# Patient Record
Sex: Female | Born: 1965
Health system: Southern US, Community
[De-identification: ages and names within clinical notes are randomized; demographics above are authoritative.]

## PROBLEM LIST (undated history)

## (undated) DIAGNOSIS — J329 Chronic sinusitis, unspecified: Secondary | ICD-10-CM

## (undated) DIAGNOSIS — R51 Headache: Secondary | ICD-10-CM

## (undated) DIAGNOSIS — F419 Anxiety disorder, unspecified: Secondary | ICD-10-CM

## (undated) DIAGNOSIS — I10 Essential (primary) hypertension: Secondary | ICD-10-CM

## (undated) DIAGNOSIS — R519 Headache, unspecified: Secondary | ICD-10-CM

## (undated) DIAGNOSIS — M62838 Other muscle spasm: Secondary | ICD-10-CM

## (undated) DIAGNOSIS — T7840XA Allergy, unspecified, initial encounter: Secondary | ICD-10-CM

## (undated) HISTORY — PX: WISDOM TOOTH EXTRACTION: SHX21

## (undated) HISTORY — DX: Anxiety disorder, unspecified: F41.9

## (undated) HISTORY — DX: Allergy, unspecified, initial encounter: T78.40XA

---

## 2005-08-01 ENCOUNTER — Other Ambulatory Visit: Admission: RE | Admit: 2005-08-01 | Discharge: 2005-08-01 | Payer: Self-pay | Admitting: *Deleted

## 2011-04-07 ENCOUNTER — Other Ambulatory Visit (HOSPITAL_COMMUNITY)
Admission: RE | Admit: 2011-04-07 | Discharge: 2011-04-07 | Disposition: A | Discharge status: Home / Self Care | Payer: BC Managed Care – PPO | Source: Ambulatory Visit | Attending: Family Medicine | Admitting: Family Medicine

## 2011-04-07 ENCOUNTER — Other Ambulatory Visit: Payer: Self-pay | Admitting: Family Medicine

## 2011-04-07 DIAGNOSIS — Z1159 Encounter for screening for other viral diseases: Secondary | ICD-10-CM | POA: Insufficient documentation

## 2011-04-07 DIAGNOSIS — Z124 Encounter for screening for malignant neoplasm of cervix: Secondary | ICD-10-CM | POA: Insufficient documentation

## 2011-12-10 ENCOUNTER — Other Ambulatory Visit: Payer: Self-pay | Admitting: Family Medicine

## 2014-06-16 ENCOUNTER — Other Ambulatory Visit: Payer: Self-pay | Admitting: Family Medicine

## 2014-06-16 ENCOUNTER — Ambulatory Visit
Admission: RE | Admit: 2014-06-16 | Discharge: 2014-06-16 | Disposition: A | Discharge status: Home / Self Care | Payer: BC Managed Care – PPO | Source: Ambulatory Visit | Attending: Family Medicine | Admitting: Family Medicine

## 2014-06-16 DIAGNOSIS — R109 Unspecified abdominal pain: Secondary | ICD-10-CM

## 2014-09-05 ENCOUNTER — Ambulatory Visit (INDEPENDENT_AMBULATORY_CARE_PROVIDER_SITE_OTHER): Payer: BC Managed Care – PPO

## 2014-09-05 ENCOUNTER — Ambulatory Visit (HOSPITAL_COMMUNITY)
Admission: RE | Admit: 2014-09-05 | Discharge: 2014-09-05 | Disposition: A | Discharge status: Home / Self Care | Payer: BC Managed Care – PPO | Source: Ambulatory Visit | Attending: Internal Medicine | Admitting: Internal Medicine

## 2014-09-05 ENCOUNTER — Ambulatory Visit (INDEPENDENT_AMBULATORY_CARE_PROVIDER_SITE_OTHER): Payer: BC Managed Care – PPO | Admitting: Internal Medicine

## 2014-09-05 VITALS — BP 120/82 | HR 96 | Temp 98.3°F | Resp 16 | Ht 68.0 in | Wt 238.4 lb

## 2014-09-05 DIAGNOSIS — M25569 Pain in unspecified knee: Secondary | ICD-10-CM | POA: Diagnosis not present

## 2014-09-05 DIAGNOSIS — M25561 Pain in right knee: Secondary | ICD-10-CM

## 2014-09-05 DIAGNOSIS — M7989 Other specified soft tissue disorders: Secondary | ICD-10-CM

## 2014-09-05 DIAGNOSIS — M79609 Pain in unspecified limb: Secondary | ICD-10-CM | POA: Diagnosis present

## 2014-09-05 LAB — POCT CBC
GRANULOCYTE PERCENT: 57.4 % (ref 37–80)
HEMATOCRIT: 48.4 % — AB (ref 37.7–47.9)
Hemoglobin: 15.8 g/dL (ref 12.2–16.2)
Lymph, poc: 4.1 — AB (ref 0.6–3.4)
MCH, POC: 31.3 pg — AB (ref 27–31.2)
MCHC: 32.7 g/dL (ref 31.8–35.4)
MCV: 95.5 fL (ref 80–97)
MID (cbc): 0.2 (ref 0–0.9)
MPV: 7.8 fL (ref 0–99.8)
POC Granulocyte: 5.9 (ref 2–6.9)
POC LYMPH %: 40.2 % (ref 10–50)
POC MID %: 2.4 %M (ref 0–12)
Platelet Count, POC: 199 10*3/uL (ref 142–424)
RBC: 5.06 M/uL (ref 4.04–5.48)
RDW, POC: 14.4 %
WBC: 10.2 10*3/uL (ref 4.6–10.2)

## 2014-09-05 LAB — D-DIMER, QUANTITATIVE (NOT AT ARMC): D DIMER QUANT: 0.27 ug{FEU}/mL (ref 0.00–0.48)

## 2014-09-05 LAB — POCT SEDIMENTATION RATE: POCT SED RATE: 9 mm/hr (ref 0–22)

## 2014-09-05 MED ORDER — TRAMADOL HCL 50 MG PO TABS: 50.0000 mg | ORAL_TABLET | Freq: Three times a day (TID) | ORAL | Status: AC | PRN

## 2014-09-05 NOTE — Progress Notes (Signed)
   Subjective:    Patient ID: Diana Moore, female    DOB: [birth year], 48 y.o.   MRN: [id number]  HPI    Review of Systems     Objective:   Physical Exam        Assessment & Plan:

## 2014-09-05 NOTE — Patient Instructions (Addendum)
Go to Admitting at [facility] and register as an outpatient for lower extremity venous doppler  Deep Vein Thrombosis A deep vein thrombosis (DVT) is a blood clot that develops in the deep, larger veins of the leg, arm, or pelvis. These are more dangerous than clots that might form in veins near the surface of the body. A DVT can lead to serious and even life-threatening complications if the clot breaks off and travels in the bloodstream to the lungs.  A DVT can damage the valves in your leg veins so that instead of flowing upward, the blood pools in the lower leg. This is called post-thrombotic syndrome, and it can result in pain, swelling, discoloration, and sores on the leg. CAUSES Usually, several things contribute to the formation of blood clots. Contributing factors include:  The flow of blood slows down.  The inside of the vein is damaged in some way.  You have a condition that makes blood clot more easily. RISK FACTORS Some people are more likely than others to develop blood clots. Risk factors include:   Smoking.  Being overweight (obese).  Sitting or lying still for a long time. This includes long-distance travel, paralysis, or recovery from an illness or surgery. Other factors that increase risk are:   Older age, especially over 21 years of age.  Having a family history of blood clots or if you have already had a blot clot.  Having major or lengthy surgery. This is especially true for surgery on the hip, knee, or belly (abdomen). Hip surgery is particularly high risk.  Having a long, thin tube (catheter) placed inside a vein during a medical procedure.  Breaking a hip or leg.  Having cancer or cancer treatment.  Pregnancy and childbirth.  Hormone changes make the blood clot more easily during pregnancy.  The fetus puts pressure on the veins of the pelvis.  There is a risk of injury to veins during delivery or a caesarean delivery. The risk is highest just after  childbirth.  Medicines containing the female hormone estrogen. This includes birth control pills and hormone replacement therapy.  Other circulation or heart problems.  SIGNS AND SYMPTOMS When a clot forms, it can either partially or totally block the blood flow in that vein. Symptoms of a DVT can include:  Swelling of the leg or arm, especially if one side is much worse.  Warmth and redness of the leg or arm, especially if one side is much worse.  Pain in an arm or leg. If the clot is in the leg, symptoms may be more noticeable or worse when standing or walking. The symptoms of a DVT that has traveled to the lungs (pulmonary embolism, PE) usually start suddenly and include:  Shortness of breath.  Coughing.  Coughing up blood or blood-tinged mucus.  Chest pain. The chest pain is often worse with deep breaths.  Rapid heartbeat. Anyone with these symptoms should get emergency medical treatment right away. Do not wait to see if the symptoms will go away. Call your local emergency services (911 in the [place] if you have these symptoms. Do not drive yourself to the hospital. DIAGNOSIS If a DVT is suspected, your health care provider will take a full medical history and perform a physical exam. Tests that also may be required include:  Blood tests, including studies of the clotting properties of the blood.  Ultrasound to see if you have clots in your legs or lungs.  X-rays to show the flow of blood  when dye is injected into the veins (venogram).  Studies of your lungs if you have any chest symptoms. PREVENTION  Exercise the legs regularly. Take a brisk 30-minute walk every day.  Maintain a weight that is appropriate for your height.  Avoid sitting or lying in bed for long periods of time without moving your legs.  Women, particularly those over the age of 35 years, should consider the risks and benefits of taking estrogen medicines, including birth control pills.  Do not smoke,  especially if you take estrogen medicines.  Long-distance travel can increase your risk of DVT. You should exercise your legs by walking or pumping the muscles every hour.  Many of the risk factors above relate to situations that exist with hospitalization, either for illness, injury, or elective surgery. Prevention may include medical and nonmedical measures.  Your health care provider will assess you for the need for venous thromboembolism prevention when you are admitted to the hospital. If you are having surgery, your surgeon will assess you the day of or day after surgery. TREATMENT Once identified, a DVT can be treated. It can also be prevented in some circumstances. Once you have had a DVT, you may be at increased risk for a DVT in the future. The most common treatment for DVT is blood-thinning (anticoagulant) medicine, which reduces the blood's tendency to clot. Anticoagulants can stop new blood clots from forming and stop old clots from growing. They cannot dissolve existing clots. Your body does this by itself over time. Anticoagulants can be given by mouth, through an IV tube, or by injection. Your health care provider will determine the best program for you. Other medicines or treatments that may be used are:  Heparin or related medicines (low molecular weight heparin) are often the first treatment for a blood clot. They act quickly. However, they cannot be taken orally and must be given either in shot form or by IV tube.  Heparin can cause a fall in a component of blood that stops bleeding and forms blood clots (platelets). You will be monitored with blood tests to be sure this does not occur.  Warfarin is an anticoagulant that can be swallowed. It takes a few days to start working, so usually heparin or related medicines are used in combination. Once warfarin is working, heparin is usually stopped.  Factor Xa inhibitor medicines, such as rivaroxaban and apixaban, also reduce blood  clotting. These medicines are taken orally and can often be used without heparin or related medicines.  Less commonly, clot dissolving drugs (thrombolytics) are used to dissolve a DVT. They carry a high risk of bleeding, so they are used mainly in severe cases where your life or a part of your body is threatened.  Very rarely, a blood clot in the leg needs to be removed surgically.  If you are unable to take anticoagulants, your health care provider may arrange for you to have a filter placed in a main vein in your abdomen. This filter prevents clots from traveling to your lungs. HOME CARE INSTRUCTIONS  Take all medicines as directed by your health care provider.  Learn as much as you can about DVT.  Wear a medical alert bracelet or carry a medical alert card.  Ask your health care provider how soon you can go back to normal activities. It is important to stay active to prevent blood clots. If you are on anticoagulant medicine, avoid contact sports.  It is very important to exercise. This is especially important  while traveling, sitting, or standing for long periods of time. Exercise your legs by walking or by tightening and relaxing your leg muscles regularly. Take frequent walks.  You may need to wear compression stockings. These are tight elastic stockings that apply pressure to the lower legs. This pressure can help keep the blood in the legs from clotting. Taking Warfarin Warfarin is a daily medicine that is taken by mouth. Your health care provider will advise you on the length of treatment (usually 3-6 months, sometimes lifelong). If you take warfarin:  Understand how to take warfarin and foods that can affect how warfarin works in Public relations account executive.  Too much and too little warfarin are both dangerous. Too much warfarin increases the risk of bleeding. Too little warfarin continues to allow the risk for blood clots. Warfarin and Regular Blood Testing While taking warfarin, you will need to  have regular blood tests to measure your blood clotting time. These blood tests usually include both the prothrombin time (PT) and international normalized ratio (INR) tests. The PT and INR results allow your health care provider to adjust your dose of warfarin. It is very important that you have your PT and INR tested as often as directed by your health care provider.  Warfarin and Your Diet Avoid major changes in your diet, or notify your health care provider before changing your diet. Arrange a visit with a registered dietitian to answer your questions. Many foods, especially foods high in vitamin K, can interfere with warfarin and affect the PT and INR results. You should eat a consistent amount of foods high in vitamin K. Foods high in vitamin K include:   Spinach, kale, broccoli, cabbage, collard and turnip greens, Brussels sprouts, peas, cauliflower, seaweed, and parsley.  Beef and pork liver.  Green tea.  Soybean oil. Warfarin with Other Medicines Many medicines can interfere with warfarin and affect the PT and INR results. You must:  Tell your health care provider about any and all medicines, vitamins, and supplements you take, including aspirin and other over-the-counter anti-inflammatory medicines. Be especially cautious with aspirin and anti-inflammatory medicines. Ask your health care provider before taking these.  Do not take or discontinue any prescribed or over-the-counter medicine except on the advice of your health care provider or pharmacist. Warfarin Side Effects Warfarin can have side effects, such as easy bruising and difficulty stopping bleeding. Ask your health care provider or pharmacist about other side effects of warfarin. You will need to:  Hold pressure over cuts for longer than usual.  Notify your dentist and other health care providers that you are taking warfarin before you undergo any procedures where bleeding may occur. Warfarin with Alcohol and Tobacco    Drinking alcohol frequently can increase the effect of warfarin, leading to excess bleeding. It is best to avoid alcoholic drinks or to consume only very small amounts while taking warfarin. Notify your health care provider if you change your alcohol intake.   Do not use any tobacco products including cigarettes, chewing tobacco, or electronic cigarettes. If you smoke, quit. Ask your health care provider for help with quitting smoking. Alternative Medicines to Warfarin: Factor Xa Inhibitor Medicines  These blood-thinning medicines are taken by mouth, usually for several weeks or longer. It is important to take the medicine every single day at the same time each day.  There are no regular blood tests required when using these medicines.  There are fewer food and drug interactions than with warfarin.  The side effects of  this class of medicine are similar to those of warfarin, including excessive bruising or bleeding. Ask your health care provider or pharmacist about other potential side effects. SEEK MEDICAL CARE IF:  You notice a rapid heartbeat.  You feel weaker or more tired than usual.  You feel faint.  You notice increased bruising.  You feel your symptoms are not getting better in the time expected.  You believe you are having side effects of medicine. SEEK IMMEDIATE MEDICAL CARE IF:  You have chest pain.  You have trouble breathing.  You have new or increased swelling or pain in one leg.  You cough up blood.  You notice blood in vomit, in a bowel movement, or in urine. MAKE SURE YOU:  Understand these instructions.  Will watch your condition.  Will get help right away if you are not doing well or get worse. Document Released: 12/08/2005 Document Revised: 04/24/2014 Document Reviewed: 08/15/2013 [facility] Patient Information 2015 [place], [place]. This information is not intended to replace advice given to you by your health care provider. Make sure you discuss any  questions you have with your health care provider. Muscle Strain A muscle strain is an injury that occurs when a muscle is stretched beyond its normal length. Usually a small number of muscle fibers are torn when this happens. Muscle strain is rated in degrees. First-degree strains have the least amount of muscle fiber tearing and pain. Second-degree and third-degree strains have increasingly more tearing and pain.  Usually, recovery from muscle strain takes 1-2 weeks. Complete healing takes 5-6 weeks.  CAUSES  Muscle strain happens when a sudden, violent force placed on a muscle stretches it too far. This may occur with lifting, sports, or a fall.  RISK FACTORS Muscle strain is especially common in athletes.  SIGNS AND SYMPTOMS At the site of the muscle strain, there may be:  Pain.  Bruising.  Swelling.  Difficulty using the muscle due to pain or lack of normal function. DIAGNOSIS  Your health care provider will perform a physical exam and ask about your medical history. TREATMENT  Often, the best treatment for a muscle strain is resting, icing, and applying cold compresses to the injured area.  HOME CARE INSTRUCTIONS   Use the PRICE method of treatment to promote muscle healing during the first 2-3 days after your injury. The PRICE method involves:  Protecting the muscle from being injured again.  Restricting your activity and resting the injured body part.  Icing your injury. To do this, put ice in a plastic bag. Place a towel between your skin and the bag. Then, apply the ice and leave it on from 15-20 minutes each hour. After the third day, switch to moist heat packs.  Apply compression to the injured area with a splint or elastic bandage. Be careful not to wrap it too tightly. This may interfere with blood circulation or increase swelling.  Elevate the injured body part above the level of your heart as often as you can.  Only take over-the-counter or prescription medicines  for pain, discomfort, or fever as directed by your health care provider.  Warming up prior to exercise helps to prevent future muscle strains. SEEK MEDICAL CARE IF:   You have increasing pain or swelling in the injured area.  You have numbness, tingling, or a significant loss of strength in the injured area. MAKE SURE YOU:   Understand these instructions.  Will watch your condition.  Will get help right away if you are not  doing well or get worse. Document Released: 12/08/2005 Document Revised: 09/28/2013 Document Reviewed: 07/07/2013 [facility] Patient Information 2015 [place], [place]. This information is not intended to replace advice given to you by your health care provider. Make sure you discuss any questions you have with your health care provider.

## 2014-09-05 NOTE — Progress Notes (Signed)
   Subjective:    Patient ID: Diana Moore, female    DOB: 28-May-1966, 48 y.o.   MRN: [id number]  HPI PCP: Dr. Sigmund Hazel 48 year old female here for right calf pain. Initially she had a sharp pain in her leg. Now the pain is more an ache. The pain has been off and on for two to three months. She can get some relief with NSAIDs. Mild swelling. The pain is medial and posterior on the calf. Broke her right leg three years ago;had a blood clot after she broke her leg.     Review of Systems     Objective:   Physical Exam  Constitutional: She is oriented to person, place, and time. She appears well-developed and well-nourished. No distress.  HENT:  Head: Normocephalic.  Eyes: EOM are normal. No scleral icterus.  Neck: Normal range of motion. Neck supple.  Cardiovascular: Normal rate.   Pulmonary/Chest: Effort normal.  Musculoskeletal: Normal range of motion. She exhibits edema and tenderness.       Right lower leg: She exhibits tenderness and swelling. She exhibits no bony tenderness, no deformity and no laceration.       Legs: Neurological: She is alert and oriented to person, place, and time. No cranial nerve deficit. She exhibits normal muscle tone. Coordination normal.  Skin: No rash noted.  Psychiatric: She has a normal mood and affect. Her behavior is normal. Judgment and thought content normal.   Tender right calf and swollen  [facility] reading (PRIMARY) by  Dr.Wynter Grave normal xr         Assessment & Plan:  Doppler Ultasound now Ddimer

## 2014-09-05 NOTE — Progress Notes (Addendum)
*  PRELIMINARY RESULTS* Vascular Ultrasound Right lower extremity venous duplex has been completed.  Preliminary findings: no evidence of DVT. There is a small hypoechoic area in the mid calf, suggesting a muscle tear.  Called results to Dr. Ernestene Mention office. Message will be relayed to Dr. Perrin Maltese. Patient can leave now.   Farrel Demark, RDMS, RVT  09/05/2014, 3:25 PM

## 2014-10-13 ENCOUNTER — Other Ambulatory Visit: Payer: Self-pay | Admitting: Obstetrics and Gynecology

## 2014-10-13 ENCOUNTER — Other Ambulatory Visit (HOSPITAL_COMMUNITY)
Admission: RE | Admit: 2014-10-13 | Discharge: 2014-10-13 | Disposition: A | Discharge status: Home / Self Care | Payer: BC Managed Care – PPO | Source: Ambulatory Visit | Attending: Obstetrics and Gynecology | Admitting: Obstetrics and Gynecology

## 2014-10-13 DIAGNOSIS — Z1151 Encounter for screening for human papillomavirus (HPV): Secondary | ICD-10-CM | POA: Diagnosis present

## 2014-10-13 DIAGNOSIS — Z01419 Encounter for gynecological examination (general) (routine) without abnormal findings: Secondary | ICD-10-CM | POA: Diagnosis present

## 2014-10-16 LAB — CYTOLOGY - PAP

## 2014-11-13 NOTE — H&P (Signed)
HPI:  General:  48yo G1P1 who presents for laparoscopic right ovarian cystectomy, possible oophorectomy, pelvic washings and bilateral salpingectomy due to complex right ovarian cyst.  In review, she reports 8/10, sharp constant pain that started ~3 days before her menses. Once her menses started, the pain was better, but still present.  An ultrasound was performed and a complex ovarian cyst was identified.       ROS:  CONSTITUTIONAL:  no Chills. no Fever. no Skin rash.  HEENT:  Blurrred vision no.  CARDIOLOGY:  no Chest pain.  RESPIRATORY:  no Shortness of breath. no Cough.  GASTROENTEROLOGY:  no Appetite change. no Change in bowel movements.  UROLOGY:  no Urinary frequency. no Urinary incontinence. no Urinary urgency.  FEMALE REPRODUCTIVE:  no Breast lumps or discharge. no Breast pain. No vaginal discharge, itching or burning.  No hot flashes or night sweats. NEUROLOGY:  no Dizziness. no Headache.         Medical History:  HTN, Hyperlipidemia, +tobaccos use, Hx migraines, Seasonal allergies, generalized anxiety disorder.        Gyn History:  Sexual activity currently sexually active.  Periods : every month, lasts 7 days and bleeding is heavy.  LMP 11/07/14.  Denies H/O Birth control.  Last pap smear date 03/2011.  Denies H/O Last mammogram date.  Abnormal pap smear 07/2005, LGSIL.        OB History:  Number of pregnancies 3, 1 miscarriage, 1 EAB, 1 c/s.  Pregnancy # 1 miscarriage.  Pregnancy # 2 miscarriage.  Pregnancy # 3 live birth, C-section, boy.        Surgical History: tympanostomy tubes bilateral 2011, C/ Section .        Hospitalization/Major Diagnostic Procedure: Not in last year 2015.        Family History: Father: alive 6271 yrs, COPD, aneurysm, diagnosed with COPDMother: alive 671 yrs, A + WBrother 1: alive 40 yrs, A + WSister 1: alive 5433 yrs, A + WSon(s): alive 16 yrs, A + W No cancers.  Uncles and aunts with COPD. All smokers.  No MIs in the family.        Social History:  General:  Tobacco use  cigarettes: Current smoker Frequency: 1/2 PPD Tobacco history last updated 10/13/2014 Smoking: yes.  Alcohol: yes, occasionally.  Caffeine: yes, coffee, soda.  no Recreational drug use.  no Diet.  no Exercise.  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  Marital Status: married.  Children: 1, Boys.        Medications:  Fluticasone Propionate 50 MCG/ACT Suspension 2 sprays Once a day, Notes: prn,  Taking Claritin(Loratadine) 10 MG Tablet 1 tablet Once a day, Notes: prn,  Taking Cyclobenzaprine HCl 10.0 Milligram Tablet 1 tablet Once a night,  Taking Xanax(ALPRAZolam) 0.25 MG Tablet 1 tablet twice a day prn anxiety,  Taking Ziac(Bisoprolol-Hydrochlorothiazide) 5-6.25 MG Tablet 1 tablet Once a day,  Taking Sumatriptan Succinate 100.0 Milligram Tablet take 1 tablet by mouth as needed Taking Percocet prn pain       Allergies: N.K.D.A.  Objective: performed in office    Vitals: Wt 238, Ht 67.75, BMI 36.45, Pulse sitting 85, BP sitting 115/84.       Examination:  General Examination:  GENERAL APPEARANCE alert, oriented, NAD, pleasant.  SKIN: normal, no rash.  LUNGS: clear to auscultation bilaterally, no wheezes, rhonchi, rales.  HEART: no murmurs, regular rate and rhythm.  ABDOMEN: soft and not tender, no masses palpated, no rebound, no rigidity, no suprapubic tenderness.  FEMALE GENITOURINARY: normal  external genitalia, labia - unremarkable, vagina - pink moist mucosa, no lesions or abnormal discharge, minimal dark blood noted c/w menses, cervix - no discharge or lesions or CMT, adnexa - no masses or tenderness, uterus - nontender and normal size on palpation- difficult to assess due to obesity.  BACK: no CVA tenderness.  EXTREMITIES: normal range of motion, no edema present.     TVUS: 10/19/14: Right complex cyst with hyperechoic and cystic components- 5.9x5.7x5.4cm- no BF noted, no free fluid in the pelvis. Left ovary  unremarkable. Uterus inhomogenous- no discrete mass seen. Concern for possible dermoid cyst.    A/P: 18XW48yo G1P1 who presents for laparoscopic right ovarian cystectomy, possible oophorectomy, bilateral salpingectomy and pelvic washings. -NPO -LR @ 125cc/hr -SCDs to OR -CBC, BMP, T&S to be completed -Risk, benefit and indications reviewed with patient including risk of bleeding, infection and injury to surrounding organs.  Questions and concerns were addressed and inform consent was obtained.  Myna HidalgoJennifer Terius Jacuinde, DO [phone] (pager) [phone] (office)

## 2014-11-21 NOTE — Patient Instructions (Addendum)
   Your procedure is scheduled on:  Thursday, Dec 3  [facility]: 11 AM Pick up the phone at the desk and dial [phone] and inform us of your arrival.  Please call this number if you have any problems the morning of surgery: [phone]  Remember: Do not eat food after midnight: Wednesday Do not drink clear liquids after: 8:30 AM Thursday, day of surgery Take these medicines the morning of surgery with a SIP OF WATER: bisoprolol and xanax   Do not wear jewelry, make-up, or FINGER nail polish No metal in your hair or on your body. Do not wear lotions, powders, perfumes.  You may wear deodorant.  Do not bring valuables to the hospital. Contacts, dentures or bridgework may not be worn into surgery.  Patients discharged on the day of surgery will not be allowed to drive home.  Home with husband Broadus JohnWarren cell [phone].

## 2014-11-22 ENCOUNTER — Encounter (HOSPITAL_COMMUNITY)
Admission: RE | Admit: 2014-11-22 | Discharge: 2014-11-22 | Disposition: A | Discharge status: Home / Self Care | Payer: BC Managed Care – PPO | Source: Ambulatory Visit | Attending: Obstetrics & Gynecology | Admitting: Obstetrics & Gynecology

## 2014-11-22 ENCOUNTER — Other Ambulatory Visit: Payer: Self-pay

## 2014-11-22 ENCOUNTER — Encounter (HOSPITAL_COMMUNITY): Payer: Self-pay

## 2014-11-22 DIAGNOSIS — N8320 Unspecified ovarian cysts: Secondary | ICD-10-CM | POA: Diagnosis present

## 2014-11-22 DIAGNOSIS — E785 Hyperlipidemia, unspecified: Secondary | ICD-10-CM | POA: Diagnosis not present

## 2014-11-22 DIAGNOSIS — E669 Obesity, unspecified: Secondary | ICD-10-CM | POA: Diagnosis not present

## 2014-11-22 DIAGNOSIS — K219 Gastro-esophageal reflux disease without esophagitis: Secondary | ICD-10-CM | POA: Diagnosis not present

## 2014-11-22 DIAGNOSIS — Z6836 Body mass index (BMI) 36.0-36.9, adult: Secondary | ICD-10-CM | POA: Diagnosis not present

## 2014-11-22 DIAGNOSIS — G43909 Migraine, unspecified, not intractable, without status migrainosus: Secondary | ICD-10-CM | POA: Diagnosis not present

## 2014-11-22 DIAGNOSIS — I10 Essential (primary) hypertension: Secondary | ICD-10-CM | POA: Diagnosis not present

## 2014-11-22 DIAGNOSIS — F17200 Nicotine dependence, unspecified, uncomplicated: Secondary | ICD-10-CM | POA: Diagnosis not present

## 2014-11-22 HISTORY — DX: Chronic sinusitis, unspecified: J32.9

## 2014-11-22 HISTORY — DX: Headache, unspecified: R51.9

## 2014-11-22 HISTORY — DX: Other muscle spasm: M62.838

## 2014-11-22 HISTORY — DX: Essential (primary) hypertension: I10

## 2014-11-22 HISTORY — DX: Headache: R51

## 2014-11-22 LAB — BASIC METABOLIC PANEL
Anion gap: 10 (ref 5–15)
BUN: 10 mg/dL (ref 6–23)
CALCIUM: 8.7 mg/dL (ref 8.4–10.5)
CO2: 23 mEq/L (ref 19–32)
Chloride: 104 mEq/L (ref 96–112)
Creatinine, Ser: 0.75 mg/dL (ref 0.50–1.10)
GFR calc non Af Amer: >90 mL/min (ref >90)
Glucose, Bld: 110 mg/dL — ABNORMAL HIGH (ref 70–99)
POTASSIUM: 4.6 meq/L (ref 3.7–5.3)
Sodium: 137 mEq/L (ref 137–147)

## 2014-11-22 LAB — TYPE AND SCREEN
ABO/RH(D): O POS
Antibody Screen: NEGATIVE

## 2014-11-22 LAB — CBC
HCT: 43.5 % (ref 36.0–46.0)
Hemoglobin: 14.9 g/dL (ref 12.0–15.0)
MCH: 32.5 pg (ref 26.0–34.0)
MCHC: 34.3 g/dL (ref 30.0–36.0)
MCV: 95 fL (ref 78.0–100.0)
PLATELETS: 190 10*3/uL (ref 150–400)
RBC: 4.58 MIL/uL (ref 3.87–5.11)
RDW: 13.5 % (ref 11.5–15.5)
WBC: 7.5 10*3/uL (ref 4.0–10.5)

## 2014-11-22 LAB — ABO/RH: ABO/RH(D): O POS

## 2014-11-23 ENCOUNTER — Ambulatory Visit (HOSPITAL_COMMUNITY): Payer: BC Managed Care – PPO | Admitting: Anesthesiology

## 2014-11-23 ENCOUNTER — Ambulatory Visit (HOSPITAL_COMMUNITY)
Admission: RE | Admit: 2014-11-23 | Discharge: 2014-11-23 | Disposition: A | Discharge status: Home / Self Care | Payer: BC Managed Care – PPO | Source: Ambulatory Visit | Attending: Obstetrics & Gynecology | Admitting: Obstetrics & Gynecology

## 2014-11-23 ENCOUNTER — Encounter (HOSPITAL_COMMUNITY): Payer: Self-pay | Admitting: *Deleted

## 2014-11-23 ENCOUNTER — Encounter (HOSPITAL_COMMUNITY)
Admission: RE | Disposition: A | Discharge status: Home / Self Care | Payer: Self-pay | Source: Ambulatory Visit | Attending: Obstetrics & Gynecology

## 2014-11-23 DIAGNOSIS — N8320 Unspecified ovarian cysts: Secondary | ICD-10-CM | POA: Diagnosis not present

## 2014-11-23 DIAGNOSIS — E785 Hyperlipidemia, unspecified: Secondary | ICD-10-CM | POA: Insufficient documentation

## 2014-11-23 DIAGNOSIS — Z6836 Body mass index (BMI) 36.0-36.9, adult: Secondary | ICD-10-CM | POA: Insufficient documentation

## 2014-11-23 DIAGNOSIS — I10 Essential (primary) hypertension: Secondary | ICD-10-CM | POA: Insufficient documentation

## 2014-11-23 DIAGNOSIS — G43909 Migraine, unspecified, not intractable, without status migrainosus: Secondary | ICD-10-CM | POA: Insufficient documentation

## 2014-11-23 DIAGNOSIS — K219 Gastro-esophageal reflux disease without esophagitis: Secondary | ICD-10-CM | POA: Insufficient documentation

## 2014-11-23 DIAGNOSIS — E669 Obesity, unspecified: Secondary | ICD-10-CM | POA: Insufficient documentation

## 2014-11-23 DIAGNOSIS — F17200 Nicotine dependence, unspecified, uncomplicated: Secondary | ICD-10-CM | POA: Insufficient documentation

## 2014-11-23 HISTORY — PX: LAPAROSCOPIC OVARIAN CYSTECTOMY: SHX6248

## 2014-11-23 LAB — PREGNANCY, URINE: PREG TEST UR: NEGATIVE

## 2014-11-23 SURGERY — EXCISION, CYST, OVARY, LAPAROSCOPIC
Anesthesia: General | Site: Abdomen | Laterality: Right

## 2014-11-23 MED ORDER — SCOPOLAMINE 1 MG/3DAYS TD PT72
MEDICATED_PATCH | TRANSDERMAL | Status: AC
  Filled 2014-11-23: qty 1

## 2014-11-23 MED ORDER — IBUPROFEN 600 MG PO TABS: 600.0000 mg | ORAL_TABLET | Freq: Four times a day (QID) | ORAL | Status: AC | PRN

## 2014-11-23 MED ORDER — GLYCOPYRROLATE 0.2 MG/ML IJ SOLN
INTRAMUSCULAR | Status: AC
  Filled 2014-11-23: qty 1

## 2014-11-23 MED ORDER — NEOSTIGMINE METHYLSULFATE 10 MG/10ML IV SOLN
INTRAVENOUS | Status: AC
  Filled 2014-11-23: qty 1

## 2014-11-23 MED ORDER — ROCURONIUM BROMIDE 100 MG/10ML IV SOLN
INTRAVENOUS | Status: DC | PRN
  Administered 2014-11-23: 10 mg via INTRAVENOUS
  Administered 2014-11-23: 40 mg via INTRAVENOUS

## 2014-11-23 MED ORDER — ONDANSETRON HCL 4 MG/2ML IJ SOLN
INTRAMUSCULAR | Status: DC | PRN
  Administered 2014-11-23: 4 mg via INTRAVENOUS

## 2014-11-23 MED ORDER — OXYCODONE-ACETAMINOPHEN 5-325 MG PO TABS: 1.0000 | ORAL_TABLET | ORAL | Status: AC | PRN

## 2014-11-23 MED ORDER — EPHEDRINE 5 MG/ML INJ
INTRAVENOUS | Status: AC
  Filled 2014-11-23: qty 10

## 2014-11-23 MED ORDER — KETOROLAC TROMETHAMINE 30 MG/ML IJ SOLN
INTRAMUSCULAR | Status: DC | PRN
  Administered 2014-11-23: 30 mg via INTRAVENOUS

## 2014-11-23 MED ORDER — LIDOCAINE HCL (CARDIAC) 20 MG/ML IV SOLN
INTRAVENOUS | Status: DC | PRN
  Administered 2014-11-23: 50 mg via INTRAVENOUS

## 2014-11-23 MED ORDER — EPHEDRINE SULFATE 50 MG/ML IJ SOLN
INTRAMUSCULAR | Status: DC | PRN
  Administered 2014-11-23 (×3): 10 mg via INTRAVENOUS
  Administered 2014-11-23: 5 mg via INTRAVENOUS
  Administered 2014-11-23: 10 mg via INTRAVENOUS
  Administered 2014-11-23: 5 mg via INTRAVENOUS
  Administered 2014-11-23 (×2): 10 mg via INTRAVENOUS
  Administered 2014-11-23: 5 mg via INTRAVENOUS

## 2014-11-23 MED ORDER — NEOSTIGMINE METHYLSULFATE 10 MG/10ML IV SOLN
INTRAVENOUS | Status: DC | PRN
  Administered 2014-11-23: 1 mg via INTRAVENOUS

## 2014-11-23 MED ORDER — BUPIVACAINE HCL (PF) 0.25 % IJ SOLN
INTRAMUSCULAR | Status: DC | PRN
  Administered 2014-11-23: 30 mL

## 2014-11-23 MED ORDER — DEXAMETHASONE SODIUM PHOSPHATE 10 MG/ML IJ SOLN
INTRAMUSCULAR | Status: DC | PRN
  Administered 2014-11-23: 4 mg via INTRAVENOUS

## 2014-11-23 MED ORDER — METOCLOPRAMIDE HCL 5 MG/ML IJ SOLN: 10.0000 mg | Freq: Once | INTRAMUSCULAR | Status: DC | PRN

## 2014-11-23 MED ORDER — MIDAZOLAM HCL 2 MG/2ML IJ SOLN
INTRAMUSCULAR | Status: AC
  Filled 2014-11-23: qty 2

## 2014-11-23 MED ORDER — FENTANYL CITRATE 0.05 MG/ML IJ SOLN: 25.0000 ug | INTRAMUSCULAR | Status: DC | PRN

## 2014-11-23 MED ORDER — PROPOFOL 10 MG/ML IV BOLUS
INTRAVENOUS | Status: DC | PRN
  Administered 2014-11-23: 180 mg via INTRAVENOUS

## 2014-11-23 MED ORDER — LIDOCAINE HCL (CARDIAC) 20 MG/ML IV SOLN
INTRAVENOUS | Status: AC
  Filled 2014-11-23: qty 5

## 2014-11-23 MED ORDER — FENTANYL CITRATE 0.05 MG/ML IJ SOLN
INTRAMUSCULAR | Status: DC | PRN
  Administered 2014-11-23: 100 ug via INTRAVENOUS

## 2014-11-23 MED ORDER — ROCURONIUM BROMIDE 100 MG/10ML IV SOLN
INTRAVENOUS | Status: AC
  Filled 2014-11-23: qty 1

## 2014-11-23 MED ORDER — HEPARIN SODIUM (PORCINE) 5000 UNIT/ML IJ SOLN
INTRAMUSCULAR | Status: AC
  Filled 2014-11-23: qty 1

## 2014-11-23 MED ORDER — HEPARIN SODIUM (PORCINE) 5000 UNIT/ML IJ SOLN
INTRAMUSCULAR | Status: DC | PRN
  Administered 2014-11-23: 5000 [IU]

## 2014-11-23 MED ORDER — PHENYLEPHRINE HCL 10 MG/ML IJ SOLN
INTRAMUSCULAR | Status: DC | PRN
  Administered 2014-11-23: 100 ug via INTRAVENOUS
  Administered 2014-11-23 (×2): 40 ug via INTRAVENOUS
  Administered 2014-11-23: 100 ug via INTRAVENOUS

## 2014-11-23 MED ORDER — FENTANYL CITRATE 0.05 MG/ML IJ SOLN
INTRAMUSCULAR | Status: DC
Start: 2014-11-23 — End: 2014-11-23
  Filled 2014-11-23: qty 2

## 2014-11-23 MED ORDER — DOCUSATE SODIUM 100 MG PO CAPS: 100.0000 mg | ORAL_CAPSULE | Freq: Two times a day (BID) | ORAL | Status: AC

## 2014-11-23 MED ORDER — KETOROLAC TROMETHAMINE 30 MG/ML IJ SOLN
INTRAMUSCULAR | Status: AC
  Filled 2014-11-23: qty 1

## 2014-11-23 MED ORDER — SILVER NITRATE-POT NITRATE 75-25 % EX MISC
CUTANEOUS | Status: DC | PRN
  Administered 2014-11-23: 1

## 2014-11-23 MED ORDER — ONDANSETRON HCL 4 MG/2ML IJ SOLN
INTRAMUSCULAR | Status: AC
  Filled 2014-11-23: qty 2

## 2014-11-23 MED ORDER — GLYCOPYRROLATE 0.2 MG/ML IJ SOLN
INTRAMUSCULAR | Status: DC | PRN
  Administered 2014-11-23: 0.2 mg via INTRAVENOUS

## 2014-11-23 MED ORDER — BUPIVACAINE HCL (PF) 0.25 % IJ SOLN
INTRAMUSCULAR | Status: AC
  Filled 2014-11-23: qty 30

## 2014-11-23 MED ORDER — FENTANYL CITRATE 0.05 MG/ML IJ SOLN
INTRAMUSCULAR | Status: AC
  Filled 2014-11-23: qty 5

## 2014-11-23 MED ORDER — SCOPOLAMINE 1 MG/3DAYS TD PT72
1.0000 | MEDICATED_PATCH | Freq: Once | TRANSDERMAL | Status: DC
  Administered 2014-11-23: 1.5 mg via TRANSDERMAL

## 2014-11-23 MED ORDER — LACTATED RINGERS IV SOLN
INTRAVENOUS | Status: DC
  Administered 2014-11-23 (×3): via INTRAVENOUS

## 2014-11-23 MED ORDER — PROPOFOL 10 MG/ML IV EMUL
INTRAVENOUS | Status: AC
  Filled 2014-11-23: qty 20

## 2014-11-23 MED ORDER — LACTATED RINGERS IV SOLN
INTRAVENOUS | Status: DC
  Administered 2014-11-23: 11:00:00 via INTRAVENOUS

## 2014-11-23 MED ORDER — MIDAZOLAM HCL 2 MG/2ML IJ SOLN
INTRAMUSCULAR | Status: DC | PRN
  Administered 2014-11-23: 2 mg via INTRAVENOUS

## 2014-11-23 MED ORDER — LACTATED RINGERS IR SOLN
Status: DC | PRN
  Administered 2014-11-23: 3000 mL

## 2014-11-23 MED ORDER — MEPERIDINE HCL 25 MG/ML IJ SOLN: 6.2500 mg | INTRAMUSCULAR | Status: DC | PRN

## 2014-11-23 SURGICAL SUPPLY — 29 items
BAG SPEC RTRVL LRG 6X4 10 (ENDOMECHANICALS) ×1
CLOTH BEACON ORANGE TIMEOUT ST (SAFETY) ×2 IMPLANT
DRSG COVADERM PLUS 2X2 (GAUZE/BANDAGES/DRESSINGS) ×4 IMPLANT
DRSG OPSITE POSTOP 3X4 (GAUZE/BANDAGES/DRESSINGS) ×1 IMPLANT
DURAPREP 26ML APPLICATOR (WOUND CARE) ×2 IMPLANT
FORCEPS CUTTING 33CM 5MM (CUTTING FORCEPS) IMPLANT
GLOVE BIOGEL PI IND STRL 6.5 (GLOVE) ×2 IMPLANT
GLOVE BIOGEL PI INDICATOR 6.5 (GLOVE) ×2
GLOVE ECLIPSE 6.5 STRL STRAW (GLOVE) ×2 IMPLANT
GOWN STRL REUS W/TWL LRG LVL3 (GOWN DISPOSABLE) ×4 IMPLANT
LIQUID BAND (GAUZE/BANDAGES/DRESSINGS) ×2 IMPLANT
NEEDLE INSUFFLATION 120MM (ENDOMECHANICALS) ×2 IMPLANT
PACK LAPAROSCOPY BASIN (CUSTOM PROCEDURE TRAY) ×2 IMPLANT
PAD OB MATERNITY 4.3X12.25 (PERSONAL CARE ITEMS) ×2 IMPLANT
PAD TRENDELENBURG OR TABLE (MISCELLANEOUS) ×2 IMPLANT
POUCH SPECIMEN RETRIEVAL 10MM (ENDOMECHANICALS) ×1 IMPLANT
PROTECTOR NERVE ULNAR (MISCELLANEOUS) ×4 IMPLANT
SET IRRIG TUBING LAPAROSCOPIC (IRRIGATION / IRRIGATOR) ×1 IMPLANT
SHEARS HARMONIC ACE PLUS 36CM (ENDOMECHANICALS) ×1 IMPLANT
SLEEVE XCEL OPT CAN 5 100 (ENDOMECHANICALS) IMPLANT
STRIP CLOSURE SKIN 1/4X4 (GAUZE/BANDAGES/DRESSINGS) IMPLANT
SUT MON AB 4-0 PS1 27 (SUTURE) ×3 IMPLANT
SUT VICRYL 0 UR6 27IN ABS (SUTURE) ×3 IMPLANT
TOWEL OR 17X24 6PK STRL BLUE (TOWEL DISPOSABLE) ×4 IMPLANT
TRAY FOLEY CATH 14FR (SET/KITS/TRAYS/PACK) ×2 IMPLANT
TROCAR XCEL NON-BLD 11X100MML (ENDOMECHANICALS) ×2 IMPLANT
TROCAR XCEL NON-BLD 5MMX100MML (ENDOMECHANICALS) ×2 IMPLANT
WARMER LAPAROSCOPE (MISCELLANEOUS) ×2 IMPLANT
WATER STERILE IRR 1000ML POUR (IV SOLUTION) ×2 IMPLANT

## 2014-11-23 NOTE — Discharge Instructions (Addendum)
HOME INSTRUCTIONS  Please note any unusual or excessive bleeding, pain, swelling. Mild dizziness or drowsiness are normal for about 24 hours after surgery.   Shower when comfortable  Restrictions: No driving for 24 hours or while taking pain medications.  Activity:  No heavy lifting (> 10 lbs), nothing in vagina (no tampons, douching, or intercourse) x 4 weeks; no tub baths for 4 weeks Vaginal spotting is expected but if your bleeding is heavy, period like,  please call the office   Incision: the bandages can be removed in the next couple of days; you may clean your incision with mild soap and water but do not rub or scrub the incision site.  You may experience slight bloody drainage from your incision periodically.  This is normal.  If you experience a large amount of drainage or the incision opens, please call your physician who will likely direct you to the emergency department.  Diet:  Regular diet as tolerated Do not eat large meals.  Eat small frequent meals throughout the day.  Continue to drink a good amount of water at least 6-8 glasses of water per day, hydration is very important for the healing process.  Pain Management: Alternate between Motrin and Percocet as needed for pain. Do not start taking the Ibuprofen ubtil after 8:15 pm tonight. For severe pain use the percocet.  This medication may cause constipation.  It is ok to use over the counter stool softener or laxative if needed.  Always take prescription pain medication with food, it may cause constipation, increase fluids and fiber and you may take an over-the-counter stool softener like Colace as needed up to 2x a day.    Alcohol -- Avoid for 24 hours and while taking pain medications.  Nausea: Take sips of ginger ale or soda  Fever -- Call physician if temperature over 101 degrees  Follow up:  If you do not already have a follow up appointment scheduled, please call the office at [phone].  If you experience fever (a  temperature greater than 100.4), pain unrelieved by pain medication, shortness of breath, swelling of a single leg, or any other symptoms which are concerning to you please the office immediately.  DISCHARGE INSTRUCTIONS: Laparoscopy  Wound care:  Do not get the incision wet for the first 24 hours. The incision should be kept clean and dry  Should the incision become sore, red, and swollen after the first week, check with your doctor.  Personal hygiene:  Shower the day after your procedure.  Activity and limitations:  Do NOT drive or operate any equipment today, or if you are taking your narcotic pain medicine.  Do NOT rest in bed all day.  Walking is encouraged. Walk each day, starting slowly with 5-minute walks 3 or 4 times a day. Slowly increase the length of your walks.  Walk up and down stairs slowly.  Do NOT do strenuous activities, such as golfing, playing tennis, bowling, running, biking, weight lifting, gardening, mowing, or vacuuming for 2-4 weeks. Ask your doctor when it is okay to start.  Return to work: This is dependent on the type of work you do. For the most part you can return to a desk job within a week of surgery. If you are more active at work, please discuss this with your doctor.  What to expect after your surgery: You may have a slight burning sensation when you urinate on the first day. You may have a very small amount of blood in the  urine. Expect to have a small amount of vaginal discharge/light bleeding for 1-2 weeks. It is not unusual to have abdominal soreness and bruising for up to 2 weeks. You may be tired and need more rest for about 1 week. You may experience shoulder pain for 24-72 hours. Lying flat in bed may relieve it.  Call your doctor for any of the following:  Develop a fever of 101 or greater  Inability to urinate 6 hours after discharge from hospital  Severe pain not relieved by pain medications  Persistent of heavy bleeding at incision  site  Redness or swelling around incision site after a week  Increasing nausea or vomiting  Patient Signature________________________________________ Nurse Signature_________________________________________

## 2014-11-23 NOTE — Transfer of Care (Signed)
Immediate Anesthesia Transfer of Care Note  Patient: Diana Moore  Procedure(s) Performed: Procedure(s): LAPAROSCOPIC RIGHT SALPINGO-OOPHORECTOMY, LEFT SALPINGECTOMY (Right)  Patient Location: PACU  Anesthesia Type:General  Level of Consciousness: awake  Airway & Oxygen Therapy: Patient Spontanous Breathing  Post-op Assessment: Report given to PACU RN  Post vital signs: stable  Filed Vitals:   11/23/14 1057  BP: 134/84  Pulse: 94  Temp: 37.1 C  Resp: 16    Complications: No apparent anesthesia complications

## 2014-11-23 NOTE — Op Note (Addendum)
PREOPERATIVE DIAGNOSIS:  Complex adnexal cyst POSTOPERATIVE DIAGNOSIS: same PROCEDURE PERFORMED: Laparoscopic right salpingo-oophorectomy, left salpingectomy SURGEON: Dr. Myna HidalgoJennifer Breland Trouten ASSISTANT:Dr. Marylou FlesherBenita Varnado ANESTHESIA: General endotracheal.  ESTIMATED BLOOD LOSS: 25cc.  URINE OUTPUT: 125cc of clear yellow urine at the end of the procedure.  IV FLUIDS: 2200cc of crystalloid.  SPECIMEN(S): 1) pelvic washings 2) bilateral tubes and right ovary COMPLICATIONS: None.  CONDITION: Stable.  FINDINGS: No ascites or peritoneal studding was appreciated.  Liver, gallbladder and bowel appeared grossly normal. Retroverted uterus normal size and shape.  Right ovary appeared enlarged ~ 5cm in size, normal left ovary, bilateral tube unremarkable.     Informed consent was obtained from the patient prior to taking her to the operating room where anesthesia was found to be adequate. She was placed in dorsal lithotomy position and examined under anesthesia. She was prepped and draped in the normal sterile fashion. The bladder was catheterized with a foley under sterile technique.  A bi-valve speculum was then placed and the anterior lip of the cervix was grasped with the single tooth tenaculum. The uterus was sounded to 9cm cm and the [place] uterine manipulator was then advanced into the uterus to provide uterine mobility. The speculum and tenaculum were then removed.  Attention was then turned to the patient's abdomen where a 10 mm infraumbilical skin incision was made with the scalpel. The veress needle was carefully introduced into the peritoneal cavity while tenting the abdominal wall. Intraperitoneal placement was confirmed by use of a saline-drop test.  The gas was connected and confirmed intrabdominal placement by a low initial pressure of 7mmHg. The abdomen was then insuflated with CO2 gas. The trocar and sleeve were then advanced without difficulty into the abdomen under direct visualization.  Intraabdominal placement was confirmed by the laparoscope and surveillance of the abdomen was performed. Grossly normal appearing abdomen with the findings as mentioned above.  Two additional 5mm skin incision were made in the left and right lower quadrants with placement of the trocar under direct visualization.   Pelvic washings were obtained.  Both ovaries were examined- the left ovary appeared unremarkable.  The right ovary appeared enlarged with solid components-unable to distinguish ovarian cyst from ovary- plan was made to proceed with oophorectomy.  The right ureter was identified.  The right adnexa was placed on tension and using the Harmonic, the right infundipulopelvic ligament was clamped and ligated. Serial ligations were used for full dissection of the right ovary and tube.  Attention was then turned to the left fallopian tube. Again, the left ovary was unremarkable and the left ureter was identified. Using the harmonic, the left fallopian tube was then dissected.  The endocatch bag was then placed through the umbilical port and the specimens were removed in their entirety. Due to the size of the cyst, the right ovary was removed in pieces through the bag.  Re-examination of the uterus and abdominal cavity confirmed hemostasis.  Trocars were removed under direct visualization with air allowed to fully escape from the abdomen.  The umbilical fascia was closed using 0-vicryl under direct visualization.  The umbilical skin incision was with 4-0 monocryl.  The bilateral lower quadrant port sites were closed with dermabond.  The manipulator was removed from the cervix, bleeding was noted and hemostasis was achieved with nitrizine. The patient tolerated the procedure well with all sponge, lap, and needle counts correct. The patient was taken to recovery in stable condition.   Dr. Dion BodyVarnado was present to assist due to patient's history of  prior surgery- concern for adhesions and complex surgical  management.  Myna HidalgoJennifer Shariah Assad, DO [phone] (pager) [phone] (office)

## 2014-11-23 NOTE — Anesthesia Preprocedure Evaluation (Signed)
Anesthesia Evaluation  Patient identified by MRN, date of birth, ID band Patient awake    Reviewed: Allergy & Precautions, H&P , NPO status , Patient's Chart, lab work & pertinent test results, reviewed documented beta blocker date and time   Airway Mallampati: I  TM Distance: >3 FB Neck ROM: Full    Dental no notable dental hx. (+) Teeth Intact   Pulmonary Current Smoker,  breath sounds clear to auscultation  Pulmonary exam normal       Cardiovascular hypertension, Pt. on medications and Pt. on home beta blockers Rhythm:Regular Rate:Normal     Neuro/Psych  Headaches, Anxiety    GI/Hepatic Neg liver ROS, GERD-  Medicated and Controlled,  Endo/Other  Obesity  Renal/GU negative Renal ROS  negative genitourinary   Musculoskeletal negative musculoskeletal ROS (+)   Abdominal (+) + obese,   Peds  Hematology   Anesthesia Other Findings   Reproductive/Obstetrics Complex ovarian Cyst                             Anesthesia Physical Anesthesia Plan  ASA: II  Anesthesia Plan: General   Post-op Pain Management:    Induction: Intravenous  Airway Management Planned: Oral ETT  Additional Equipment:   Intra-op Plan:   Post-operative Plan: Extubation in OR  Informed Consent: I have reviewed the patients History and Physical, chart, labs and discussed the procedure including the risks, benefits and alternatives for the proposed anesthesia with the patient or authorized representative who has indicated his/her understanding and acceptance.   Dental advisory given  Plan Discussed with: CRNA, Anesthesiologist and Surgeon  Anesthesia Plan Comments:         Anesthesia Quick Evaluation

## 2014-11-23 NOTE — Anesthesia Postprocedure Evaluation (Signed)
  Anesthesia Post-op Note  Patient: Diana Moore  Procedure(s) Performed: Procedure(s): LAPAROSCOPIC RIGHT SALPINGO-OOPHORECTOMY, LEFT SALPINGECTOMY (Right)  Patient Location: PACU  Anesthesia Type:General  Level of Consciousness: awake, alert  and oriented  Airway and Oxygen Therapy: Patient Spontanous Breathing  Post-op Pain: mild  Post-op Assessment: Post-op Vital signs reviewed, Patient's Cardiovascular Status Stable, Respiratory Function Stable, Patent Airway, No signs of Nausea or vomiting and Pain level controlled  Post-op Vital Signs: Reviewed and stable  Last Vitals:  Filed Vitals:   11/23/14 1545  BP:   Pulse: 80  Temp: 36.6 C  Resp: 16    Complications: No apparent anesthesia complications

## 2014-11-23 NOTE — Interval H&P Note (Signed)
History and Physical Interval Note:  11/23/2014 12:14 PM  Diana Moore  has presented today for surgery, with the diagnosis of N83.29 Right Ovariam Cyst  The various methods of treatment have been discussed with the patient and family. After consideration of risks, benefits and other options for treatment, the patient has consented to  Procedure(s): LAPAROSCOPIC OVARIAN CYSTECTOMY (Right) possible oophorectomy, bilateral salpingectomy as a surgical intervention .  The patient's history has been reviewed, patient examined, no change in status, stable for surgery.  I have reviewed the patient's chart and labs.  Questions were answered to the patient's satisfaction.     [name], Teddie Curd, M

## 2014-11-24 ENCOUNTER — Encounter (HOSPITAL_COMMUNITY): Payer: Self-pay | Admitting: Obstetrics & Gynecology

## 2014-11-25 MED FILL — Heparin Sodium (Porcine) Inj 5000 Unit/ML: INTRAMUSCULAR | Qty: 1 | Status: AC

## 2017-03-09 ENCOUNTER — Other Ambulatory Visit: Payer: Self-pay | Admitting: Obstetrics & Gynecology

## 2017-03-09 ENCOUNTER — Other Ambulatory Visit (HOSPITAL_COMMUNITY)
Admission: RE | Admit: 2017-03-09 | Discharge: 2017-03-09 | Disposition: A | Discharge status: Home / Self Care | Payer: BLUE CROSS/BLUE SHIELD | Source: Ambulatory Visit | Attending: Obstetrics and Gynecology | Admitting: Obstetrics and Gynecology

## 2017-03-09 DIAGNOSIS — Z01419 Encounter for gynecological examination (general) (routine) without abnormal findings: Secondary | ICD-10-CM | POA: Insufficient documentation

## 2017-03-11 LAB — CYTOLOGY - PAP: Diagnosis: NEGATIVE

## 2018-04-28 ENCOUNTER — Ambulatory Visit: Payer: Self-pay | Admitting: Obstetrics & Gynecology

## 2019-01-05 ENCOUNTER — Other Ambulatory Visit: Payer: Self-pay | Admitting: Family Medicine

## 2019-01-05 DIAGNOSIS — Z1231 Encounter for screening mammogram for malignant neoplasm of breast: Secondary | ICD-10-CM

## 2019-02-01 ENCOUNTER — Ambulatory Visit
Admission: RE | Admit: 2019-02-01 | Discharge: 2019-02-01 | Disposition: A | Discharge status: Home / Self Care | Payer: BLUE CROSS/BLUE SHIELD | Source: Ambulatory Visit | Attending: Family Medicine | Admitting: Family Medicine

## 2019-02-01 DIAGNOSIS — Z1231 Encounter for screening mammogram for malignant neoplasm of breast: Secondary | ICD-10-CM

## 2019-02-16 ENCOUNTER — Other Ambulatory Visit: Payer: Self-pay | Admitting: Family Medicine

## 2019-02-16 DIAGNOSIS — R928 Other abnormal and inconclusive findings on diagnostic imaging of breast: Secondary | ICD-10-CM

## 2019-02-23 ENCOUNTER — Ambulatory Visit
Admission: RE | Admit: 2019-02-23 | Discharge: 2019-02-23 | Disposition: A | Discharge status: Home / Self Care | Payer: Managed Care, Other (non HMO) | Source: Ambulatory Visit | Attending: Family Medicine | Admitting: Family Medicine

## 2019-02-23 DIAGNOSIS — R928 Other abnormal and inconclusive findings on diagnostic imaging of breast: Secondary | ICD-10-CM

## 2020-11-22 ENCOUNTER — Other Ambulatory Visit: Payer: Self-pay | Admitting: Family Medicine

## 2020-11-22 DIAGNOSIS — Z1231 Encounter for screening mammogram for malignant neoplasm of breast: Secondary | ICD-10-CM

## 2020-11-26 ENCOUNTER — Other Ambulatory Visit: Payer: Self-pay

## 2020-11-26 ENCOUNTER — Ambulatory Visit
Admission: RE | Admit: 2020-11-26 | Discharge: 2020-11-26 | Disposition: A | Discharge status: Home / Self Care | Payer: Managed Care, Other (non HMO) | Source: Ambulatory Visit | Attending: Family Medicine | Admitting: Family Medicine

## 2020-11-26 DIAGNOSIS — Z1231 Encounter for screening mammogram for malignant neoplasm of breast: Secondary | ICD-10-CM

## 2022-03-31 ENCOUNTER — Ambulatory Visit
Admission: RE | Admit: 2022-03-31 | Discharge: 2022-03-31 | Disposition: A | Discharge status: Home / Self Care | Payer: Managed Care, Other (non HMO) | Source: Ambulatory Visit | Attending: Sports Medicine | Admitting: Sports Medicine

## 2022-03-31 ENCOUNTER — Other Ambulatory Visit: Payer: Self-pay | Admitting: Sports Medicine

## 2022-03-31 DIAGNOSIS — M542 Cervicalgia: Secondary | ICD-10-CM

## 2022-03-31 DIAGNOSIS — M25511 Pain in right shoulder: Secondary | ICD-10-CM

## 2022-12-17 IMAGING — CR DG CERVICAL SPINE 2 OR 3 VIEWS
2 series · 2 of 2 positions shown · non-contrast
Comparison: X-ray cervical 04/25/2009.

CLINICAL DATA: Neck pain for 3 weeks.

EXAM:
CERVICAL SPINE - 2-3 VIEW

[w c-spine lat *]
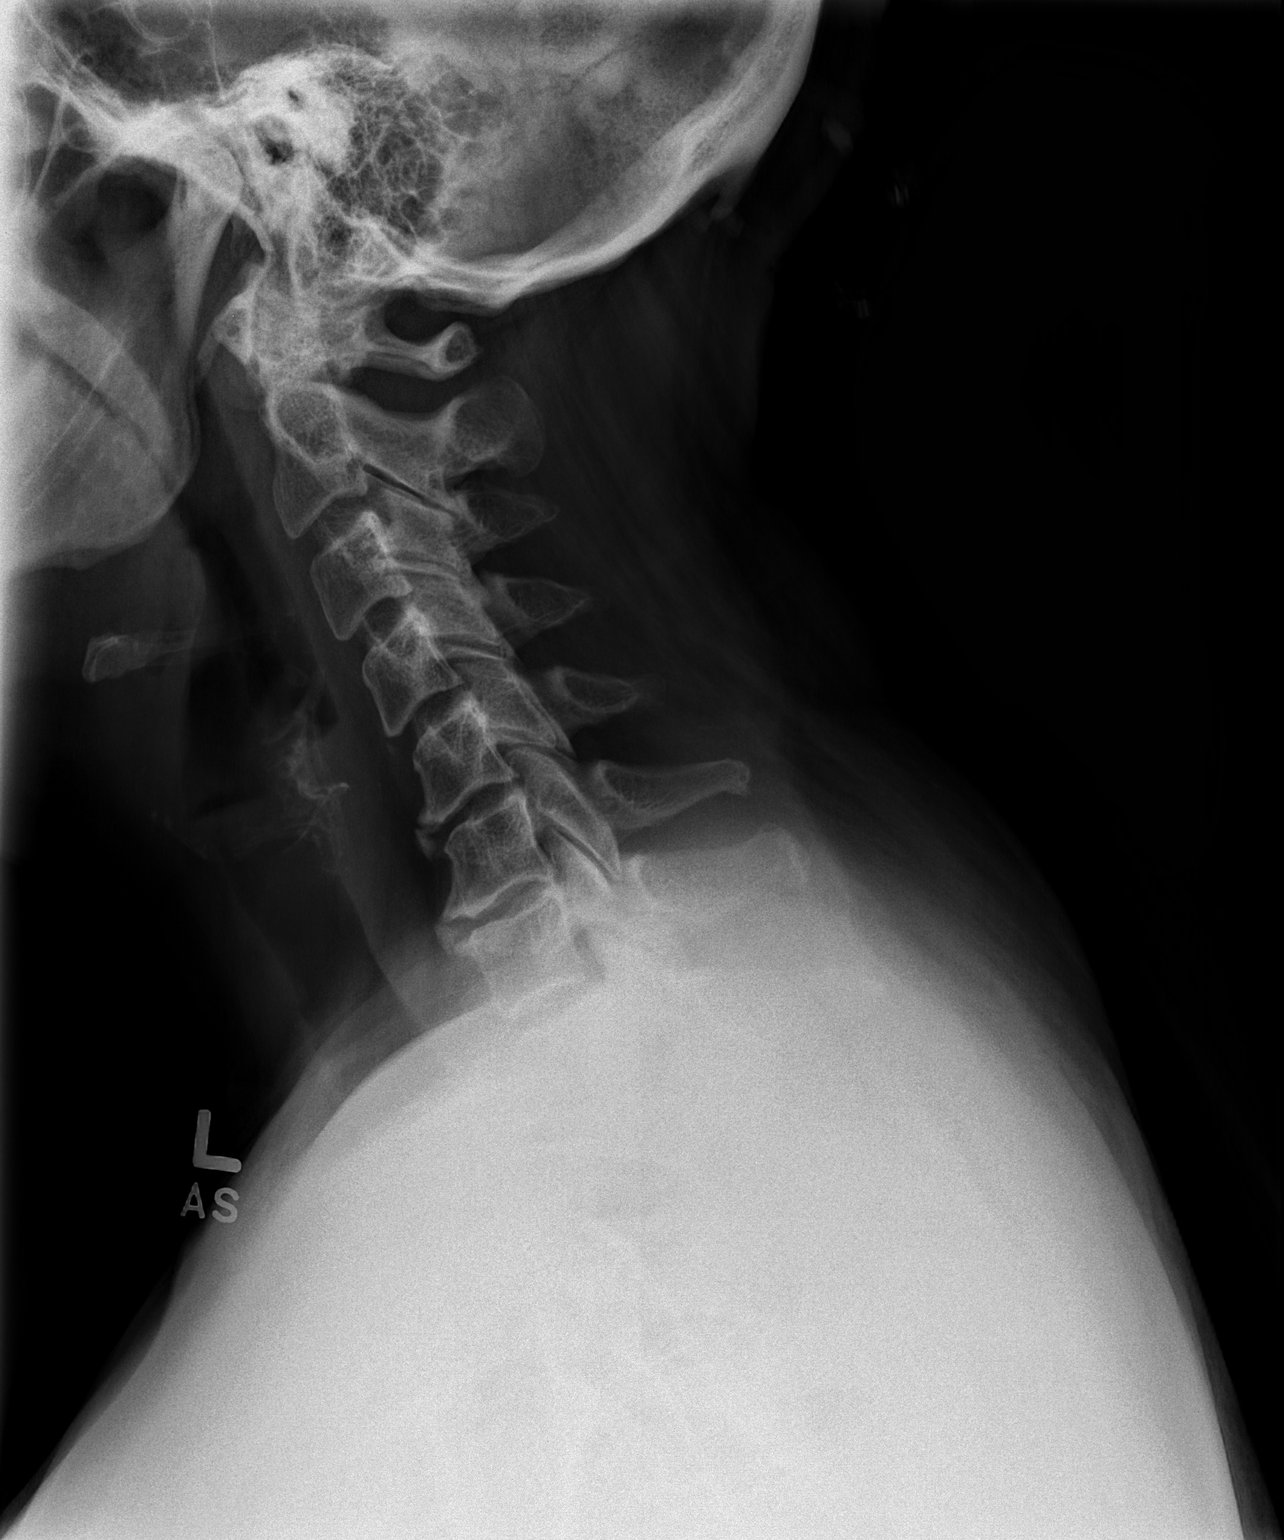

[w c-spine a.p.]
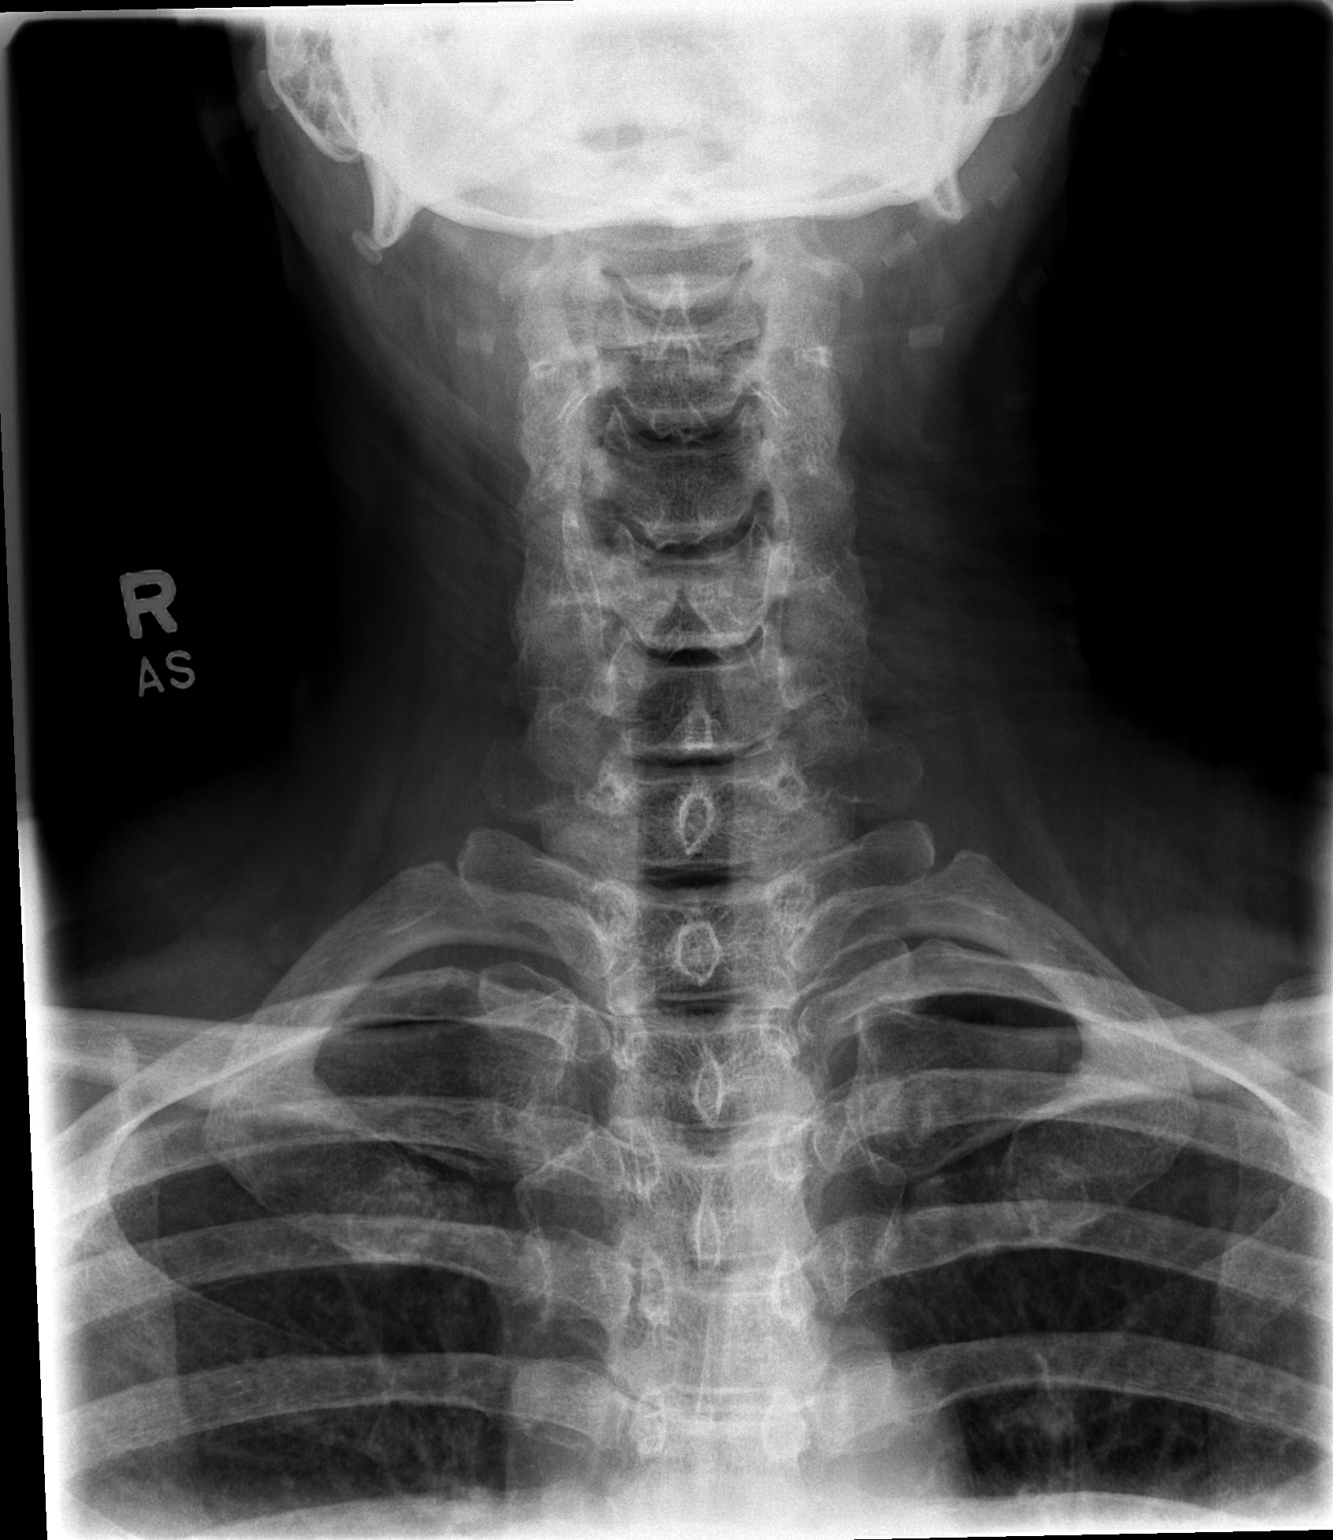

[2 of 2 positions shown; findings below may reference images not displayed]

FINDINGS: Mild reversal of cervical lordosis. Mild to moderate disc space
narrowing C5-C6 and C6-C7. Normal prevertebral soft tissue thickness
IMPRESSION: Mild reversal of cervical lordosis with degenerative changes C5
through C7

## 2022-12-17 IMAGING — CR DG SHOULDER 2+V*R*
3 series · 3 of 3 positions shown · non-contrast
Comparison: None.

CLINICAL DATA: Bilateral shoulder pain for 3 weeks; unspecified
chronicity.

EXAM:
RIGHT SHOULDER - 2+ VIEW

[w shoulder ap internal righ]
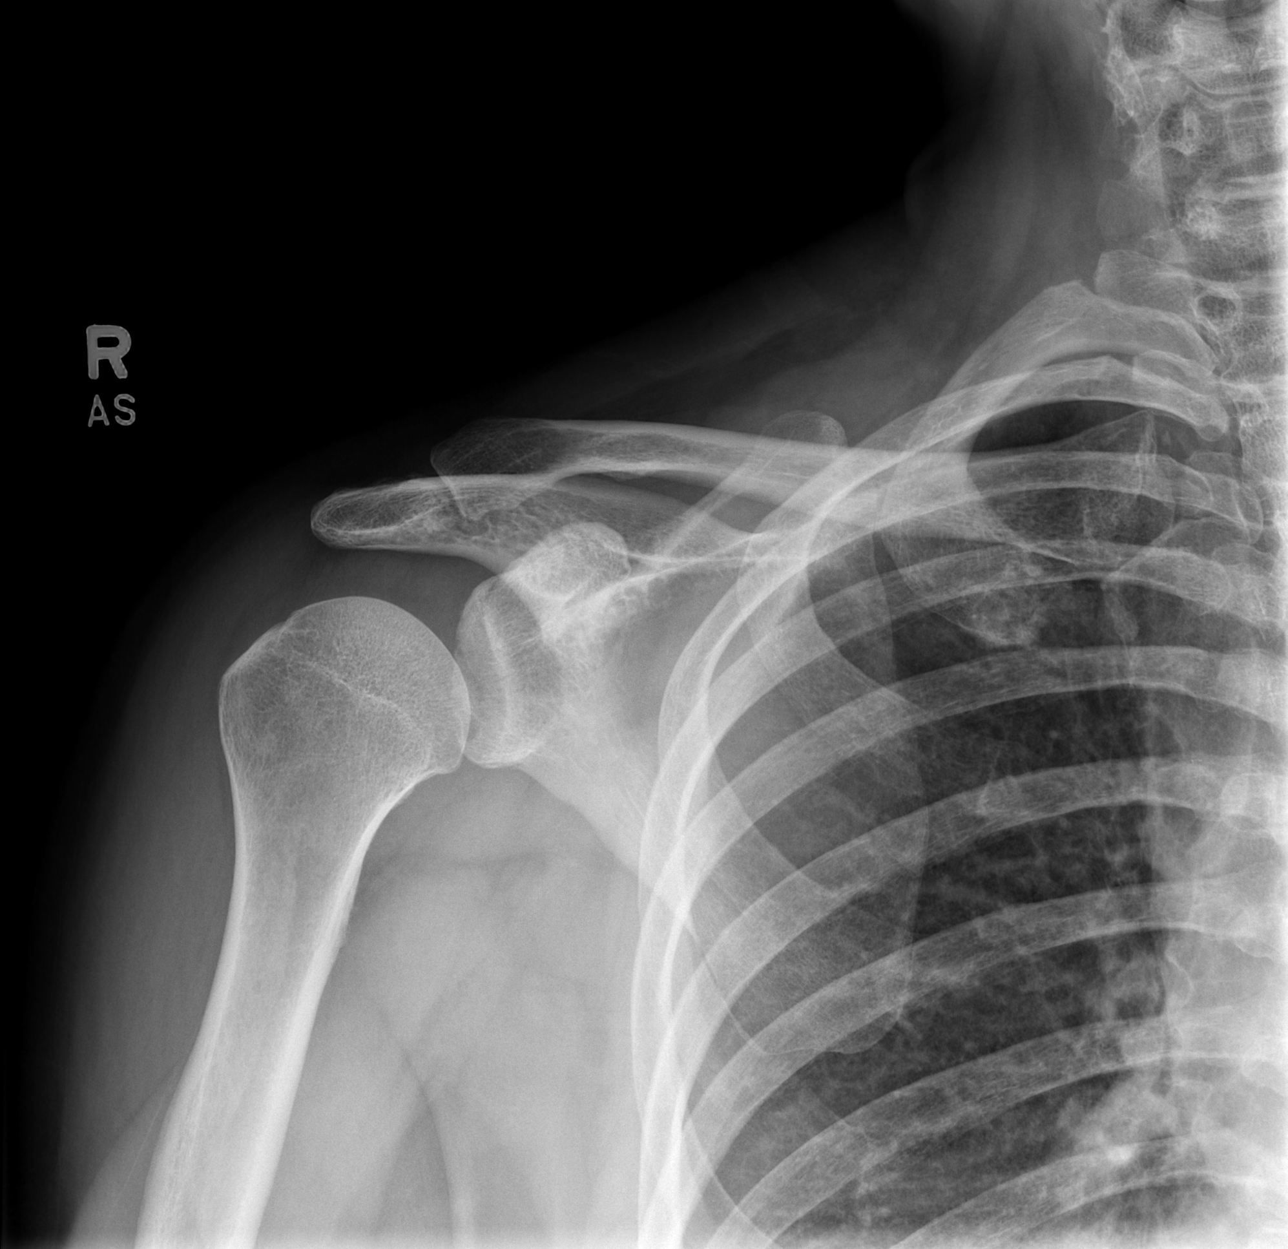

[w shoulder y view right]
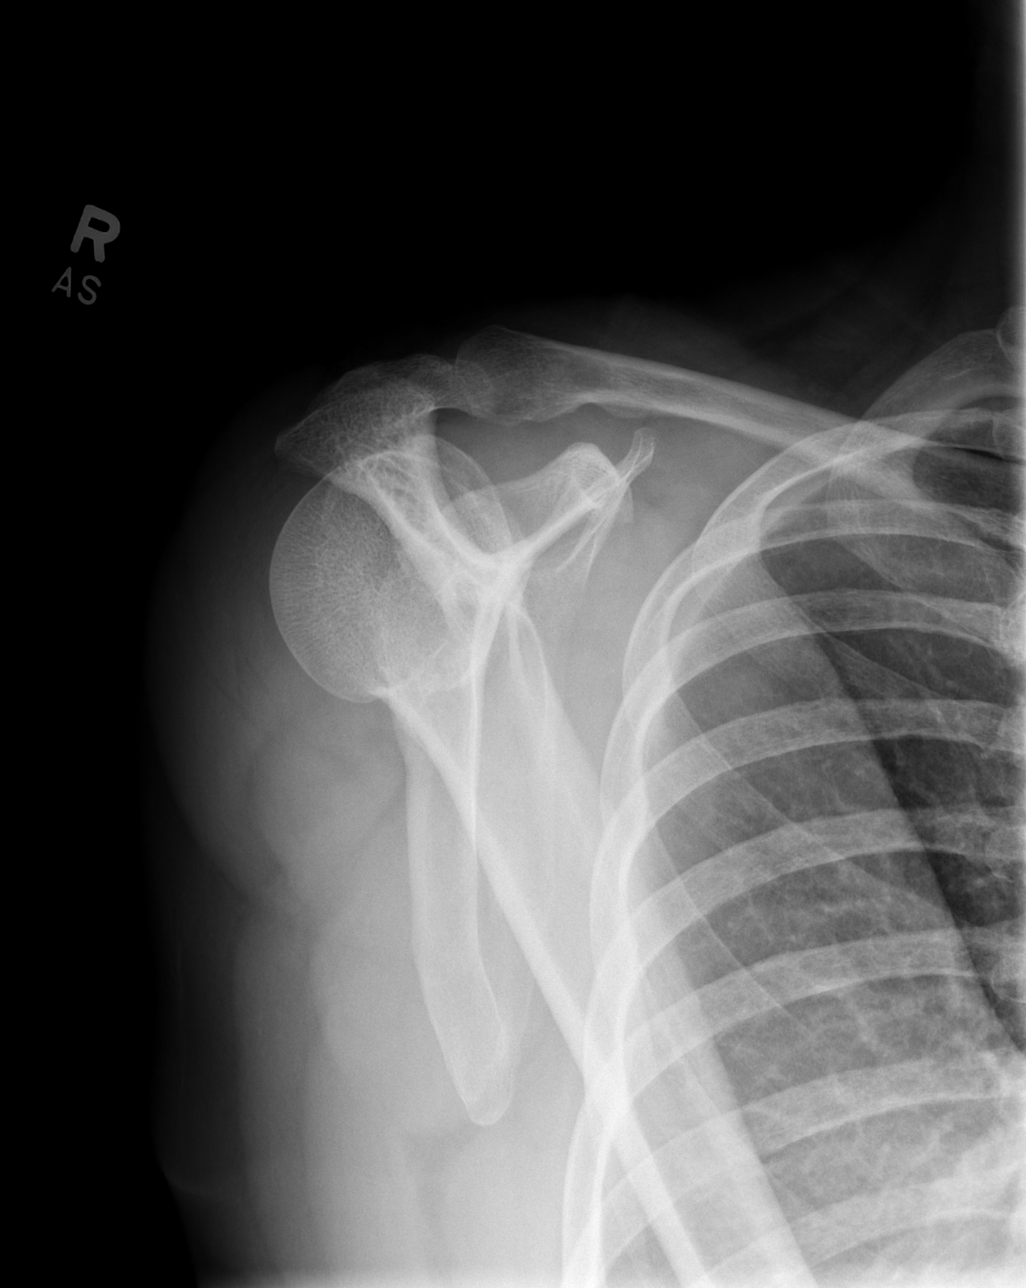

[w shoulder axillary right *]
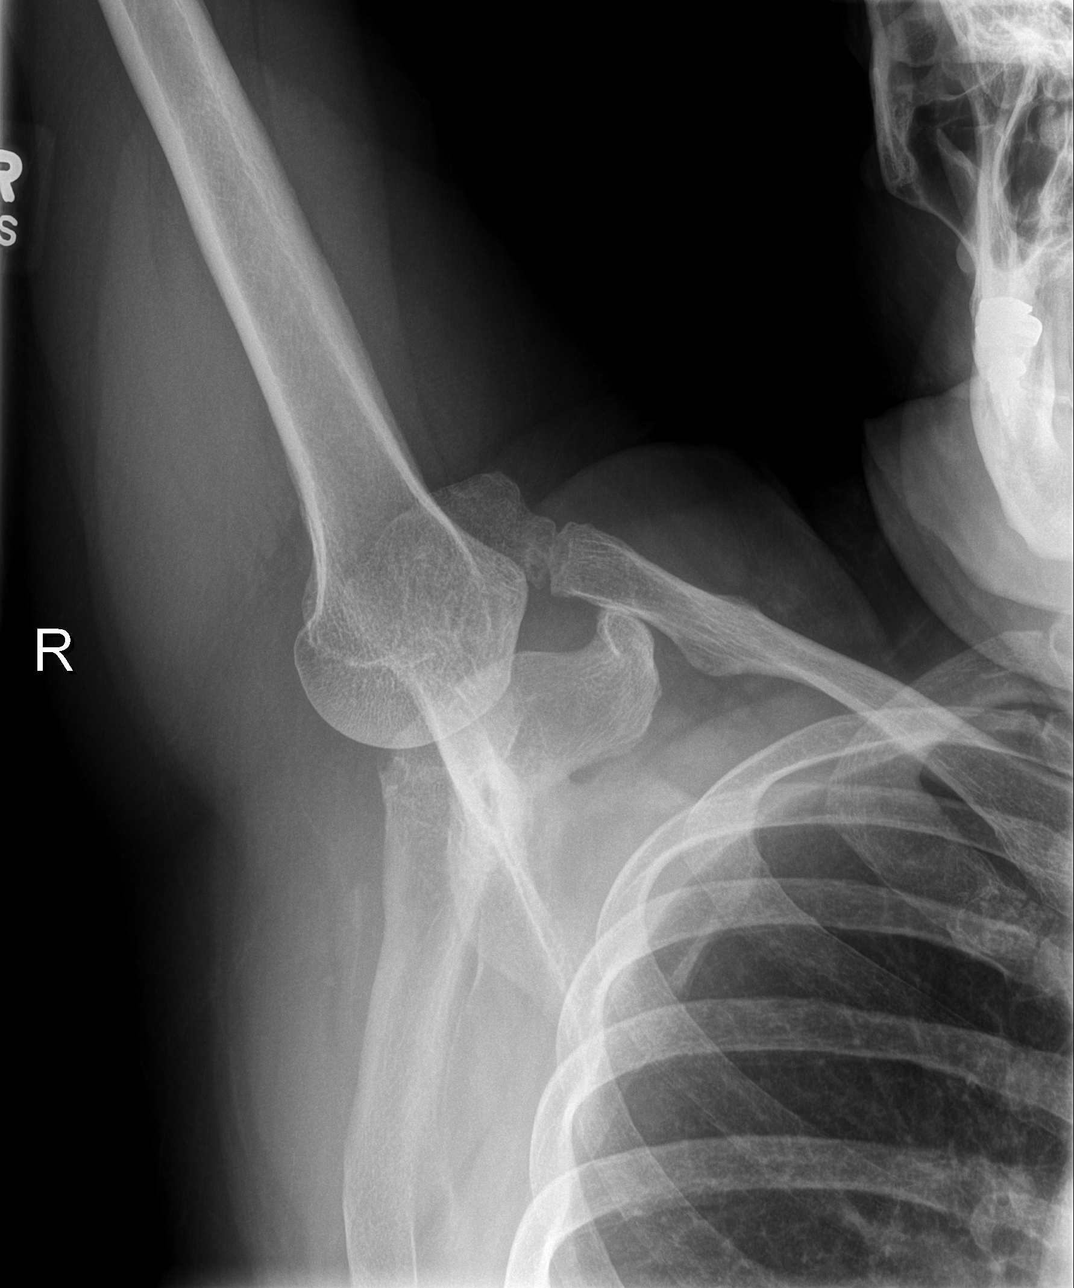

[3 of 3 positions shown; findings below may reference images not displayed]

FINDINGS: There is no evidence of fracture or dislocation. There is mild
degenerative narrowing of the acromioclavicular joint. Glenohumeral
joint space appears maintained. Minimal cystic change in the glenoid
is likely degenerative. Soft tissues are unremarkable.
IMPRESSION: 1. No acute abnormality.
2. Mild degenerative changes.
